# Patient Record
Sex: Female | Born: 1980 | Race: White | Hispanic: No | Marital: Single | Smoking: Current some day smoker
Health system: Southern US, Community
[De-identification: ages and names within clinical notes are randomized; demographics above are authoritative.]

---

## 2007-08-23 ENCOUNTER — Emergency Department: Payer: Self-pay | Admitting: Emergency Medicine

## 2010-03-18 ENCOUNTER — Ambulatory Visit: Payer: Self-pay | Admitting: Family Medicine

## 2015-03-13 ENCOUNTER — Ambulatory Visit: Payer: Medicaid Other

## 2015-03-13 ENCOUNTER — Ambulatory Visit
Admission: EM | Admit: 2015-03-13 | Discharge: 2015-03-13 | Disposition: A | Discharge status: Home / Self Care | Payer: Medicaid Other | Attending: Internal Medicine | Admitting: Internal Medicine

## 2015-03-13 ENCOUNTER — Encounter: Payer: Self-pay | Admitting: Emergency Medicine

## 2015-03-13 DIAGNOSIS — M25571 Pain in right ankle and joints of right foot: Secondary | ICD-10-CM | POA: Diagnosis present

## 2015-03-13 DIAGNOSIS — S82201A Unspecified fracture of shaft of right tibia, initial encounter for closed fracture: Secondary | ICD-10-CM

## 2015-03-13 DIAGNOSIS — S82301A Unspecified fracture of lower end of right tibia, initial encounter for closed fracture: Secondary | ICD-10-CM | POA: Diagnosis not present

## 2015-03-13 DIAGNOSIS — X58XXXA Exposure to other specified factors, initial encounter: Secondary | ICD-10-CM | POA: Insufficient documentation

## 2015-03-13 MED ORDER — NAPROXEN 500 MG PO TABS: 500.0000 mg | ORAL_TABLET | Freq: Two times a day (BID) | ORAL | Status: DC

## 2015-03-13 MED ORDER — HYDROCODONE-ACETAMINOPHEN 5-325 MG PO TABS: 2.0000 | ORAL_TABLET | ORAL | Status: DC | PRN

## 2015-03-13 NOTE — Discharge Instructions (Signed)
Wear boot until orthopedics says you can take it off.  Use crutches to keep weight off of R foot/ankle, until orthopedics clears you to put weight on that side. Elevate/use ice to help with swelling/pain. Prescriptions for naproxen and a small number of vicodin for pain. Followup with orthopedics in about 5-7 days for recheck.  [facility]/Dr Poggi [phone].  Your PCP may need to make the referral.  Tibial Fracture A tibial fracture is a break in your tibia bone. The tibia is the large shinbone in your lower leg. The bone will be held in place with a cast or splint until it is healed. HOME CARE  Put ice on the injured area.  Put ice in a plastic bag.  Place a towel between your skin and the bag.  Leave the ice on for 15-20 minutes, 03-04 times a day, for 2 days.  If you have a cast:  Do not scratch under the cast.  Check the skin around the cast every day. You may put lotion on any red or sore areas.  Keep your cast dry and clean.  If you have a splint:  Wear the splint as told by your doctor.  Loosen the elastic around the splint if your toes get numb, tingle, or turn cold or blue.  Do not put pressure on the cast or splint until it is hard.  Do not put the cast or splint in water. Cover it with a plastic bag when bathing.  Use crutches as told by your doctor.  Only take medicine as told by your doctor.  See your doctor as told for follow-up visits. GET HELP RIGHT AWAY IF:  Pain gets worse or is not controlled with medicine.  Puffiness (swelling) or redness gets worse.  You start to lose feeling in your foot or toes.  Your foot or toes get cold or blue.  You have pain in your leg, especially if it gets worse when you move your toes. MAKE SURE YOU:  Understand these instructions.  Will watch your condition.  Will get help right away if you are not doing well or get worse. Document Released: 10/03/2010 Document Revised: 11/23/2011  Document Reviewed: 10/25/2013 [facility] Patient Information 2015 [place]. This information is not intended to replace advice given to you by your health care provider. Make sure you discuss any questions you have with your health care provider.

## 2015-03-13 NOTE — ED Notes (Signed)
Patient states that she fell and twisted her right ankle and right foot yesterday.  Patient c/o pain in her right ankle and inner side of her right foot.

## 2015-03-14 NOTE — ED Provider Notes (Addendum)
CSN: [id number]     Arrival date & time 03/13/15  1611 History   First MD Initiated Contact with Patient 03/13/15 1650     Chief Complaint  Patient presents with  . Foot Pain  . Ankle Pain   Patient is a 34 y.o. female presenting with lower extremity pain and ankle pain.  Foot Pain  Ankle Pain    Pt twisted R foot/ankle while walking on uneven ground yesterday, and hyperextended R foot. Has not been able to bear weight on foot since due to severe pain.  No other injury reported.  Hx prior sprain in R ankle.  Ankle is swollen today; equivocal bruising.  History reviewed. No pertinent past medical history. History reviewed. No pertinent past surgical history. History reviewed. No pertinent family history. History  Substance Use Topics  . Smoking status: Current Some Day Smoker  . Smokeless tobacco: Never Used  . Alcohol Use: Yes   OB History    No data available     Review of Systems  All other systems reviewed and are negative.   Allergies  Penicillins  Home Medications no meds taken regularly.   Prior to Admission medications                         BP 142/78 mmHg  Pulse 80  Temp(Src) 97.8 F (36.6 C) (Tympanic)  Resp 16  Ht 5\' 3"  (1.6 m)  Wt 130 lb (58.968 kg)  BMI 23.03 kg/m2  SpO2 100%  LMP 03/11/2015 (Exact Date) Physical Exam  Constitutional: She is oriented to person, place, and time. No distress.  Alert, nicely groomed Using crutches.  HENT:  Head: Atraumatic.  Eyes:  Conjugate gaze, no eye redness/drainage  Neck: Neck supple.  Cardiovascular: Normal rate.   Pulmonary/Chest: No respiratory distress.  Abdominal: She exhibits no distension.  Musculoskeletal: Normal range of motion.  R ankle with lateral and medial swelling.  Focal tenderness deep to achilles tendon posteriorly.  Equivocal bruising.  No bony tenderness of lateral or medial malleolus.  Soft tissue tenderness anterior to lateral malleolus.  Skin intact.  Foot warm.  Limited ROM  ankle (pain); easily wiggles toes.  Neurological: She is alert and oriented to person, place, and time.  Skin: Skin is warm and dry.  No cyanosis  Nursing note and vitals reviewed.   ED Course  Procedures    Dg Ankle Complete Right  03/13/2015   CLINICAL DATA:  Twisting injury with ankle pain, swelling and bruising. Initial encounter.  EXAM: RIGHT ANKLE - COMPLETE 3+ VIEW  COMPARISON:  None.  FINDINGS: There is a subtle nondisplaced intra-articular fracture of the distal tibia posteriorly, best seen on the lateral view. This extends to the tibial plafond. No other fractures demonstrated. The talar dome appears intact. There is no dislocation. Marked lateral soft tissue swelling noted.  IMPRESSION: Nondisplaced intra-articular fracture involving the posterior aspect of the tibial plafond.   Electronically Signed   By: Carey BullocksWilliam  Veazey M.D.   On: 03/13/2015 17:20   Dg Foot Complete Right  03/13/2015   CLINICAL DATA:  Right foot injury, pain and swelling, stepping on stone on sandal  EXAM: RIGHT FOOT COMPLETE - 3+ VIEW  COMPARISON:  None.  FINDINGS: Three views of the right foot submitted. No acute fracture or subluxation. No radiopaque foreign body.  IMPRESSION: Negative.   Electronically Signed   By: Natasha MeadLiviu  Pop M.D.   On: 03/13/2015 17:18     MDM  1. Tibial fracture, right, closed, initial encounter    Boot orthosis applied by nurse.  Pt instructed to use crutches, non weight bearing until cleared by orthopedics to put weight on the R foot/ankle.  Leave orthosis on until followup with orthopedist.  Ice, elevate, to help with pain/swelling.  Followup ortho in 5-7 days.  Given office number for [facility] ortho (Dr Joice Lofts on call).    Eustace Moore, MD 03/18/15 1191  Eustace Moore, MD 03/18/15 [phone]

## 2015-10-26 ENCOUNTER — Emergency Department: Payer: Medicaid Other

## 2015-10-26 ENCOUNTER — Ambulatory Visit
Admission: EM | Admit: 2015-10-26 | Discharge: 2015-10-26 | Disposition: A | Discharge status: Other Acute Inpatient Hospital | Payer: Medicaid Other | Attending: Family Medicine | Admitting: Family Medicine

## 2015-10-26 ENCOUNTER — Encounter: Payer: Self-pay | Admitting: Emergency Medicine

## 2015-10-26 ENCOUNTER — Emergency Department
Admission: EM | Admit: 2015-10-26 | Discharge: 2015-10-26 | Disposition: A | Discharge status: Other Acute Inpatient Hospital | Payer: Medicaid Other | Attending: Emergency Medicine | Admitting: Emergency Medicine

## 2015-10-26 DIAGNOSIS — K223 Perforation of esophagus: Secondary | ICD-10-CM | POA: Insufficient documentation

## 2015-10-26 DIAGNOSIS — F17210 Nicotine dependence, cigarettes, uncomplicated: Secondary | ICD-10-CM | POA: Insufficient documentation

## 2015-10-26 DIAGNOSIS — R1110 Vomiting, unspecified: Secondary | ICD-10-CM

## 2015-10-26 DIAGNOSIS — Z3202 Encounter for pregnancy test, result negative: Secondary | ICD-10-CM | POA: Diagnosis not present

## 2015-10-26 DIAGNOSIS — Z880 Allergy status to penicillin: Secondary | ICD-10-CM | POA: Insufficient documentation

## 2015-10-26 DIAGNOSIS — R112 Nausea with vomiting, unspecified: Secondary | ICD-10-CM | POA: Diagnosis present

## 2015-10-26 DIAGNOSIS — K209 Esophagitis, unspecified without bleeding: Secondary | ICD-10-CM

## 2015-10-26 DIAGNOSIS — N289 Disorder of kidney and ureter, unspecified: Secondary | ICD-10-CM | POA: Insufficient documentation

## 2015-10-26 DIAGNOSIS — Z791 Long term (current) use of non-steroidal anti-inflammatories (NSAID): Secondary | ICD-10-CM | POA: Insufficient documentation

## 2015-10-26 DIAGNOSIS — R110 Nausea: Secondary | ICD-10-CM | POA: Diagnosis not present

## 2015-10-26 DIAGNOSIS — F1010 Alcohol abuse, uncomplicated: Secondary | ICD-10-CM | POA: Diagnosis not present

## 2015-10-26 DIAGNOSIS — K920 Hematemesis: Secondary | ICD-10-CM | POA: Insufficient documentation

## 2015-10-26 DIAGNOSIS — F1011 Alcohol abuse, in remission: Secondary | ICD-10-CM

## 2015-10-26 LAB — CBC WITH DIFFERENTIAL/PLATELET
BASOS ABS: 0.1 10*3/uL (ref 0–0.1)
Basophils Relative: 1 %
EOS PCT: 0 %
Eosinophils Absolute: 0 10*3/uL (ref 0–0.7)
HEMATOCRIT: 43.5 % (ref 35.0–47.0)
HEMOGLOBIN: 13.6 g/dL (ref 12.0–16.0)
LYMPHS PCT: 5 %
Lymphs Abs: 1.1 10*3/uL (ref 1.0–3.6)
MCH: 29.4 pg (ref 26.0–34.0)
MCHC: 31.2 g/dL — ABNORMAL LOW (ref 32.0–36.0)
MCV: 94.3 fL (ref 80.0–100.0)
Monocytes Absolute: 1.2 10*3/uL — ABNORMAL HIGH (ref 0.2–0.9)
Monocytes Relative: 6 %
NEUTROS ABS: 19.6 10*3/uL — AB (ref 1.4–6.5)
Neutrophils Relative %: 88 %
PLATELETS: 343 10*3/uL (ref 150–440)
RBC: 4.61 MIL/uL (ref 3.80–5.20)
RDW: 17.1 % — ABNORMAL HIGH (ref 11.5–14.5)
WBC: 22 10*3/uL — AB (ref 3.6–11.0)

## 2015-10-26 LAB — URINALYSIS COMPLETE WITH MICROSCOPIC (ARMC ONLY)
BACTERIA UA: NONE SEEN
Bilirubin Urine: NEGATIVE
Glucose, UA: NEGATIVE mg/dL
Leukocytes, UA: NEGATIVE
NITRITE: NEGATIVE
PH: 5 (ref 5.0–8.0)
Protein, ur: 100 mg/dL — AB
Specific Gravity, Urine: 1.015 (ref 1.005–1.030)

## 2015-10-26 LAB — COMPREHENSIVE METABOLIC PANEL
ALK PHOS: 92 U/L (ref 38–126)
ALT: 96 U/L — AB (ref 14–54)
ALT: 92 U/L — ABNORMAL HIGH (ref 14–54)
ANION GAP: 29 — AB (ref 5–15)
AST: 132 U/L — AB (ref 15–41)
AST: 122 U/L — ABNORMAL HIGH (ref 15–41)
Albumin: 5.6 g/dL — ABNORMAL HIGH (ref 3.5–5.0)
Albumin: 5.3 g/dL — ABNORMAL HIGH (ref 3.5–5.0)
Alkaline Phosphatase: 89 U/L (ref 38–126)
Anion gap: 28 — ABNORMAL HIGH (ref 5–15)
BILIRUBIN TOTAL: 1.7 mg/dL — AB (ref 0.3–1.2)
BUN: 13 mg/dL (ref 6–20)
BUN: 13 mg/dL (ref 6–20)
CALCIUM: 9 mg/dL (ref 8.9–10.3)
CHLORIDE: 97 mmol/L — AB (ref 101–111)
CO2: 8 mmol/L — AB (ref 22–32)
CO2: 8 mmol/L — ABNORMAL LOW (ref 22–32)
CREATININE: 1.02 mg/dL — AB (ref 0.44–1.00)
Calcium: 8.9 mg/dL (ref 8.9–10.3)
Chloride: 101 mmol/L (ref 101–111)
Creatinine, Ser: 1.15 mg/dL — ABNORMAL HIGH (ref 0.44–1.00)
GFR calc Af Amer: >60 mL/min (ref >60)
GFR calc non Af Amer: >60 mL/min (ref >60)
GFR calc non Af Amer: >60 mL/min (ref >60)
GLUCOSE: 188 mg/dL — AB (ref 65–99)
Glucose, Bld: 165 mg/dL — ABNORMAL HIGH (ref 65–99)
Potassium: 4.7 mmol/L (ref 3.5–5.1)
Potassium: 5.2 mmol/L — ABNORMAL HIGH (ref 3.5–5.1)
Sodium: 133 mmol/L — ABNORMAL LOW (ref 135–145)
Sodium: 138 mmol/L (ref 135–145)
TOTAL PROTEIN: 9.3 g/dL — AB (ref 6.5–8.1)
Total Bilirubin: 1.4 mg/dL — ABNORMAL HIGH (ref 0.3–1.2)
Total Protein: 9.5 g/dL — ABNORMAL HIGH (ref 6.5–8.1)

## 2015-10-26 LAB — CBC
HEMATOCRIT: 41.5 % (ref 35.0–47.0)
Hemoglobin: 12.9 g/dL (ref 12.0–16.0)
MCH: 28.8 pg (ref 26.0–34.0)
MCHC: 31 g/dL — ABNORMAL LOW (ref 32.0–36.0)
MCV: 92.8 fL (ref 80.0–100.0)
Platelets: 325 10*3/uL (ref 150–440)
RBC: 4.47 MIL/uL (ref 3.80–5.20)
RDW: 17.2 % — AB (ref 11.5–14.5)
WBC: 19.9 10*3/uL — ABNORMAL HIGH (ref 3.6–11.0)

## 2015-10-26 LAB — POCT PREGNANCY, URINE: PREG TEST UR: NEGATIVE

## 2015-10-26 LAB — HCG, QUANTITATIVE, PREGNANCY

## 2015-10-26 LAB — LACTIC ACID, PLASMA: LACTIC ACID, VENOUS: 0.9 mmol/L (ref 0.5–2.0)

## 2015-10-26 LAB — LIPASE, BLOOD: Lipase: 33 U/L (ref 11–51)

## 2015-10-26 MED ORDER — IOHEXOL 240 MG/ML SOLN
25.0000 mL | Freq: Once | INTRAMUSCULAR | Status: DC | PRN
Start: 2015-10-26 — End: 2015-10-27

## 2015-10-26 MED ORDER — LORAZEPAM 2 MG/ML IJ SOLN
1.0000 mg | Freq: Once | INTRAMUSCULAR | Status: AC
  Administered 2015-10-26: 1 mg via INTRAVENOUS
  Filled 2015-10-26: qty 1

## 2015-10-26 MED ORDER — SODIUM CHLORIDE 0.9 % IV BOLUS (SEPSIS)
1000.0000 mL | Freq: Once | INTRAVENOUS | Status: AC
Start: 2015-10-26 — End: 2015-10-26
  Administered 2015-10-26: 1000 mL via INTRAVENOUS

## 2015-10-26 MED ORDER — ONDANSETRON HCL 4 MG/2ML IJ SOLN
INTRAMUSCULAR | Status: AC
  Filled 2015-10-26: qty 2

## 2015-10-26 MED ORDER — MORPHINE SULFATE (PF) 4 MG/ML IV SOLN
INTRAVENOUS | Status: AC
  Filled 2015-10-26: qty 1

## 2015-10-26 MED ORDER — SODIUM CHLORIDE 0.9 % IV BOLUS (SEPSIS)
1000.0000 mL | Freq: Once | INTRAVENOUS | Status: AC
  Administered 2015-10-26: 1000 mL via INTRAVENOUS

## 2015-10-26 MED ORDER — PROMETHAZINE HCL 25 MG/ML IJ SOLN
25.0000 mg | Freq: Once | INTRAMUSCULAR | Status: AC
  Administered 2015-10-26: 25 mg via INTRAVENOUS
  Filled 2015-10-26: qty 1

## 2015-10-26 MED ORDER — ONDANSETRON HCL 4 MG/2ML IJ SOLN
4.0000 mg | Freq: Once | INTRAMUSCULAR | Status: AC
  Administered 2015-10-26: 4 mg via INTRAVENOUS

## 2015-10-26 MED ORDER — FAMOTIDINE IN NACL 20-0.9 MG/50ML-% IV SOLN
20.0000 mg | Freq: Once | INTRAVENOUS | Status: AC
  Administered 2015-10-26: 20 mg via INTRAVENOUS
  Filled 2015-10-26: qty 50

## 2015-10-26 MED ORDER — ONDANSETRON HCL 4 MG/2ML IJ SOLN
4.0000 mg | Freq: Once | INTRAMUSCULAR | Status: AC
  Administered 2015-10-26: 4 mg via INTRAVENOUS
  Filled 2015-10-26: qty 2

## 2015-10-26 MED ORDER — PROMETHAZINE HCL 25 MG/ML IJ SOLN
25.0000 mg | Freq: Once | INTRAMUSCULAR | Status: AC
  Administered 2015-10-26: 25 mg via INTRAMUSCULAR

## 2015-10-26 MED ORDER — DEXTROSE 5 % IV SOLN
2.0000 g | Freq: Once | INTRAVENOUS | Status: AC
  Administered 2015-10-26: 2 g via INTRAVENOUS
  Filled 2015-10-26: qty 2

## 2015-10-26 MED ORDER — MORPHINE SULFATE (PF) 4 MG/ML IV SOLN
4.0000 mg | Freq: Once | INTRAVENOUS | Status: AC
  Administered 2015-10-26: 4 mg via INTRAVENOUS

## 2015-10-26 MED ORDER — VANCOMYCIN HCL IN DEXTROSE 1-5 GM/200ML-% IV SOLN
1000.0000 mg | Freq: Once | INTRAVENOUS | Status: AC
  Administered 2015-10-26: 1000 mg via INTRAVENOUS
  Filled 2015-10-26 (×2): qty 200

## 2015-10-26 MED ORDER — IOHEXOL 300 MG/ML  SOLN
100.0000 mL | Freq: Once | INTRAMUSCULAR | Status: AC | PRN
  Administered 2015-10-26: 100 mL via INTRAVENOUS

## 2015-10-26 NOTE — ED Notes (Signed)
Pt left department with [place] em[facility] in no acute distress. Pt sleeping, resps unlabored. Mother has clothing and cell phone. Pt transported with yellow colored necklace in place.

## 2015-10-26 NOTE — ED Provider Notes (Signed)
CSN: [id number]     Arrival date & time 10/26/15  1234 History   None    Chief Complaint  Patient presents with  . Hematemesis    Pt reports vomiting blood starting today. Reports she passed out in the bathroom at home.    (Consider location/radiation/quality/duration/timing/severity/associated sxs/prior Treatment) HPI Comments: 35 yo female with a h/o alcohol abuse, presents with vomiting since 1am with intermittent bright red blood and dark brown emesis; also stomach pains. Denies fevers, chills, diarrhea. States has "a couple of drinks almost every day" and yesterday had "2 drinks of rum and coke". Denies chest pains but today has felt short of breath.   The history is provided by the patient and a parent.    No past medical history on file. No past surgical history on file. No family history on file. Social History  Substance Use Topics  . Smoking status: Current Every Day Smoker -- 1.00 packs/day    Types: Cigarettes  . Smokeless tobacco: Never Used  . Alcohol Use: Yes     Comment: last drink last night, drinks most days   OB History    No data available     Review of Systems  Allergies  Penicillins  Home Medications   Prior to Admission medications   Medication Sig Start Date End Date Taking? Authorizing Provider  HYDROcodone-acetaminophen (NORCO/VICODIN) 5-325 MG per tablet Take 2 tablets by mouth every 4 (four) hours as needed. 03/13/15   Eustace Moore, MD  naproxen (NAPROSYN) 500 MG tablet Take 1 tablet (500 mg total) by mouth 2 (two) times daily. 03/13/15   Eustace Moore, MD   Meds Ordered and Administered this Visit   Medications  promethazine (PHENERGAN) injection 25 mg (25 mg Intramuscular Given 10/26/15 1251)    BP 123/87 mmHg  Pulse 143  Resp 22  SpO2 99%  LMP 10/19/2015 No data found.   Physical Exam  Constitutional: She appears well-developed and well-nourished. She does not appear ill.  Cardiovascular: Regular rhythm and normal heart sounds.   Tachycardia present.   Pulmonary/Chest: Effort normal and breath sounds normal. No respiratory distress. She has no wheezes. She has no rales.  Abdominal: Soft. Bowel sounds are normal. She exhibits no distension and no mass. There is tenderness (epigastric ). There is no rebound and no guarding.  Neurological: She is alert.  Skin: She is not diaphoretic.  Nursing note and vitals reviewed.   ED Course  Procedures (including critical care time)  Labs Review Labs Reviewed  CBC WITH DIFFERENTIAL/PLATELET - Abnormal; Notable for the following:    WBC 22.0 (*)    MCHC 31.2 (*)    RDW 17.1 (*)    Neutro Abs 19.6 (*)    Monocytes Absolute 1.2 (*)    All other components within normal limits  COMPREHENSIVE METABOLIC PANEL  HCG, QUANTITATIVE, PREGNANCY    Imaging Review No results found.   Visual Acuity Review  Right Eye Distance:   Left Eye Distance:   Bilateral Distance:    Right Eye Near:   Left Eye Near:    Bilateral Near:      Positive hemoccult emesis    MDM   1. Coffee ground emesis   2. Hematemesis with nausea   3. H/O alcohol abuse     Recommend patient go to ED by EMS for further evaluation and management. IVF started. Phenergan  IM given. CBC, CMP labs drawn.  Patient transported left facility by EMS in stable condition. Report called  to Consulting civil engineer at [facility] ED.   Payton Mccallum, MD 10/26/15 1316

## 2015-10-26 NOTE — ED Provider Notes (Addendum)
[facility] Emergency Department Provider Note  ____________________________________________  Time seen: Approximately 2:13 PM  I have reviewed the triage vital signs and the nursing notes.   HISTORY  Chief Complaint Emesis    HPI Carly King is a 35 y.o. female , otherwise healthy, presenting with nausea and vomiting. Patient reports that her entire family has had nausea vomiting and diarrhea for the past week. For the past several days, she has had decreased appetite and "my stomach has felt funny." Yesterday she did not eat at all, then drank 2 rum and cokes. She woke up several hours later with ongoing controllable nausea and vomiting, dry heaving. No abdominal pain, diarrhea. No urinary symptoms but the patient has bilateral low back pain since onset of her symptoms. No change in vaginal discharge. She was seen at urgent care was given IM Phenergan and sent over here, and received Zofran on arrival and is still actively heaving.   History reviewed. No pertinent past medical history.  There are no active problems to display for this patient.   No past surgical history on file.  Current Outpatient Rx  Name  Route  Sig  Dispense  Refill  . HYDROcodone-acetaminophen (NORCO/VICODIN) 5-325 MG per tablet   Oral   Take 2 tablets by mouth every 4 (four) hours as needed.   6 tablet   0   . naproxen (NAPROSYN) 500 MG tablet   Oral   Take 1 tablet (500 mg total) by mouth 2 (two) times daily.   30 tablet   0     Allergies Penicillins  No family history on file.  Social History Social History  Substance Use Topics  . Smoking status: Current Some Day Smoker -- 1.00 packs/day    Types: Cigarettes  . Smokeless tobacco: Never Used  . Alcohol Use: Yes     Comment: last drink last night, drinks most days    Review of Systems Constitutional: No fever/chills. No lightheadedness or syncope. Eyes: No visual changes. ENT: No sore  throat. Cardiovascular: Denies chest pain, palpitations. Respiratory: Denies shortness of breath.  No cough. Gastrointestinal: No abdominal pain.  Positive nausea, positive vomiting.  No diarrhea.  No constipation. Genitourinary: Negative for dysuria. Musculoskeletal: Positive low back pain. Skin: Negative for rash. Neurological: Negative for headaches, focal weakness or numbness.  10-point ROS otherwise negative.  ____________________________________________   PHYSICAL EXAM:  VITAL SIGNS: ED Triage Vitals  Enc Vitals Group     BP 10/26/15 1334 139/81 mmHg     Pulse Rate 10/26/15 1334 144     Resp 10/26/15 1334 26     Temp 10/26/15 1334 98.1 F (36.7 C)     Temp Source 10/26/15 1334 Axillary     SpO2 10/26/15 1334 99 %     Weight 10/26/15 1334 130 lb (58.968 kg)     Height 10/26/15 1334  (1.6 m)     Head Cir --      Peak Flow --      Pain Score 10/26/15 1335 6     Pain Loc --      Pain Edu? --      Excl. in GC? --     Constitutional: Alert and oriented. Well appearing and in no acute distress. Answer question appropriately. Eyes: Conjunctivae are normal.  EOMI. no scleral icterus Head: Atraumatic. Nose: No congestion/rhinnorhea. Mouth/Throat: Mucous membranes are dry. Actively dry heaving throughout my examination. Neck: No stridor.  Supple.  No JVD. Cardiovascular: Normal rate,  regular rhythm. No murmurs, rubs or gallops.  Respiratory: Normal respiratory effort.  No retractions. Lungs CTAB.  No wheezes, rales or ronchi. Gastrointestinal: Soft and nontender. No distention. No peritoneal signs. No guarding or rebound. Negative Murphy sign. Musculoskeletal: No LE edema.  Neurologic:  Normal speech and language. No gross focal neurologic deficits are appreciated.  Skin:  Skin is warm, dry and intact. No rash noted. Psychiatric: Mood and affect are normal. Speech and behavior are normal.  Normal judgement.  ____________________________________________   LABS (all  labs ordered are listed, but only abnormal results are displayed)  Labs Reviewed  CBC - Abnormal; Notable for the following:    WBC 19.9 (*)    MCHC 31.0 (*)    RDW 17.2 (*)    All other components within normal limits  COMPREHENSIVE METABOLIC PANEL - Abnormal; Notable for the following:    Potassium 5.2 (*)    CO2 8 (*)    Glucose, Bld 188 (*)    Creatinine, Ser 1.15 (*)    Total Protein 9.3 (*)    Albumin 5.3 (*)    AST 122 (*)    ALT 92 (*)    Total Bilirubin 1.7 (*)    Anion gap 29 (*)    All other components within normal limits  URINALYSIS COMPLETEWITH MICROSCOPIC ([facility] ONLY) - Abnormal; Notable for the following:    Color, Urine YELLOW (*)    APPearance CLEAR (*)    Ketones, ur 2+ (*)    Hgb urine dipstick 2+ (*)    Protein, ur 100 (*)    Squamous Epithelial / LPF 0-5 (*)    All other components within normal limits  LIPASE, BLOOD  POC URINE PREG, ED  POCT PREGNANCY, URINE   ____________________________________________  EKG  EKG, read by me.  Time: 14:35 Rate: 137 Rhythm: sinus tach Axis: normal Intervals:  Normal ST: no STEMI ____________________________________________  RADIOLOGY  No results found.  ____________________________________________   PROCEDURES  Procedure(s) performed: None  Critical Care performed: No ____________________________________________   INITIAL IMPRESSION / ASSESSMENT AND PLAN / ED COURSE  Pertinent labs & imaging results that were available during my care of the patient were reviewed by me and considered in my medical decision making (see chart for details).  35 y.o. female presenting with intractable nausea and vomiting for the past several hours. The patient is afebrile with a sinus tachycardia. The most likely etiology of her symptoms is a viral GI infection or flulike illness which she contracted from her family. However, her alcohol use could have also contributed to her symptoms although this does not sound  like a large amount of alcohol for her. I will rule out pregnancy and urinary tract infection given the low back pain. There is no focal pain on her abdominal examination.  ----------------------------------------- 2:20 PM on 10/26/2015 -----------------------------------------  The patient does have a white blood cell count of approximately 20 between here and there urgent care center. I will get a CT scan of her abdomen to rule out any acute pathology. Will continue to be aggressive about treating her symptoms.  ----------------------------------------- 3:14 PM on 10/26/2015 -----------------------------------------  The patient was feeling better with improved nausea, but then began to have vomiting when she attempted to ingest contrast. The vomit is a darkish brown but it is unclear whether it is blood in it is a minimal amount. Given the amount of heaving that the patient has had is not unreasonable that she may have some irritation causing a  mild amount of bleeding but at this time there is no evidence for severe amount of bleeding. She does have some tachycardia but this is improving with fluids and her hematocrit is normal. We will continue to monitor any vomitus that she has. I have talked to the CT tech to common in the patient for CT without oral contrast as I do not believe she is currently able to tolerate it.  ----------------------------------------- 3:42 PM on 10/26/2015 -----------------------------------------  Pt signed out to Dr. Shaune Pollack, who will f/u CT abd and re-evaluate the patient and her symptoms.  ____________________________________________  FINAL CLINICAL IMPRESSION(S) / ED DIAGNOSES  Final diagnoses:  Acute renal insufficiency  Intractable vomiting with nausea, vomiting of unspecified type      NEW MEDICATIONS STARTED DURING THIS VISIT:  New Prescriptions   No medications on file     Rockne Menghini, MD 10/26/15 1543  Rockne Menghini,  MD 10/26/15 1557

## 2015-10-26 NOTE — ED Notes (Signed)
Pt to ED via EMS from [facility] c/o emesis with "coffee grounds" appearance. Per pt vomiting started this morning around 0100. Pt given  Phenergan at urgent care. No diarrhea, no urinary symptoms. EMS reports x1 episode vomiting en route, EMS state they did not note coffee grounds at that time. Per EMS, pt has hx alcohol abuse, pt states last drink was last night around 2000.

## 2015-10-26 NOTE — ED Notes (Signed)
Pt sleeping, pt's family updated on transfer process.

## 2015-10-26 NOTE — ED Notes (Signed)
Ems here for transport

## 2015-10-26 NOTE — ED Provider Notes (Signed)
[facility]  I accepted care from [facility] ____________________________________________     RADIOLOGY All xrays were viewed by me. Imaging interpreted by radiologist.  Ct abd pel:   IMPRESSION: 1. Fairly marked thickening of the walls of the lower thoracic esophagus, with associated paraesophageal fluid/inflammation. Esophagitis? Boerhaave syndrome? 2. Probable fatty infiltration of the liver. 3. Remainder of the abdomen and pelvis CT is unremarkable. No free intraperitoneal air.  ____________________________________________   PROCEDURES  Procedure(s) performed: None  Critical Care performed: None  ____________________________________________   INITIAL IMPRESSION / ASSESSMENT AND PLAN / ED COURSE   Pertinent labs & imaging results that were available during my care of the patient were reviewed by me and considered in my medical decision making (see chart for details).  Suspect virus vs alcoholic gastritis.  CT showing esophagitis vs Boerhaave's.  Given possibility of esophageal rupture, discussed transfer and pt accepted ER to ER to [facility], Dr. Ranell Patrick.  I spoke with CT surgery Dr. Oneida Alar to eval in ER.  Vancomycin and Cefepime given for possible esophageal rupture.  HR to 130s, BP 130's systolic.  Given  ativan for possible contribution from alcohol withdrawal.   Patient / Family / Caregiver informed of clinical course, medical decision-making process, and agree with plan.  ____________________________________________   FINAL CLINICAL IMPRESSION(S) / ED DIAGNOSES  Final diagnoses:  Acute renal insufficiency  Intractable vomiting with nausea, vomiting of unspecified type  Esophagitis  Boerhaave's syndrome  Boerhaave's Syndrome      Governor Rooks, MD 10/26/15 1946

## 2015-10-26 NOTE — ED Notes (Signed)
IV inserted per protocol #20g RAC, good blood return. Labs drawn and sent. IVF NS initiated.

## 2015-10-26 NOTE — ED Notes (Signed)
Report received from [place], [place]. Pt with mother and daughter at bedside. Explanation of plan of care provided to pt and mother who verbalize understanding. Pt with pwd skin. Pt placed on cardiac monitor and gown. Second iv site initiated. Blood cultures and lactic acid obtained.

## 2015-10-26 NOTE — ED Notes (Signed)
EMS is at bedside for transport. Mother and daughter at bedside.

## 2015-10-31 LAB — CULTURE, BLOOD (ROUTINE X 2)
Culture: NO GROWTH
Culture: NO GROWTH

## 2015-11-09 ENCOUNTER — Ambulatory Visit
Admission: EM | Admit: 2015-11-09 | Discharge: 2015-11-09 | Disposition: A | Discharge status: Home / Self Care | Payer: Medicaid Other | Attending: Family Medicine | Admitting: Family Medicine

## 2015-11-09 DIAGNOSIS — J029 Acute pharyngitis, unspecified: Secondary | ICD-10-CM | POA: Diagnosis present

## 2015-11-09 DIAGNOSIS — J069 Acute upper respiratory infection, unspecified: Secondary | ICD-10-CM | POA: Diagnosis not present

## 2015-11-09 DIAGNOSIS — B9789 Other viral agents as the cause of diseases classified elsewhere: Secondary | ICD-10-CM

## 2015-11-09 LAB — RAPID STREP SCREEN (MED CTR MEBANE ONLY): STREPTOCOCCUS, GROUP A SCREEN (DIRECT): NEGATIVE

## 2015-11-09 MED ORDER — GUAIFENESIN-CODEINE 100-10 MG/5ML PO SOLN: 10.0000 mL | Freq: Four times a day (QID) | ORAL | Status: DC | PRN

## 2015-11-09 NOTE — ED Provider Notes (Signed)
CSN: [id number]     Arrival date & time 11/09/15  1115 History   First MD Initiated Contact with Patient 11/09/15 1258     Chief Complaint  Patient presents with  . Sore Throat  . Cough   (Consider location/radiation/quality/duration/timing/severity/associated sxs/prior Treatment) Patient is a 35 y.o. female presenting with URI. The history is provided by the patient.  URI Presenting symptoms: congestion, cough, fatigue, fever, rhinorrhea and sore throat   Severity:  Moderate Onset quality:  Sudden Duration:  3 days Timing:  Constant Progression:  Unchanged Chronicity:  New Relieved by:  None tried Worsened by:  Nothing tried Ineffective treatments:  None tried Associated symptoms: no arthralgias, no headaches, no myalgias, no neck pain, no sinus pain, no sneezing, no swollen glands and no wheezing   Risk factors: sick contacts   Risk factors: not elderly, no chronic cardiac disease, no chronic kidney disease, no chronic respiratory disease, no diabetes mellitus, no immunosuppression, no recent illness and no recent travel     History reviewed. No pertinent past medical history. History reviewed. No pertinent past surgical history. History reviewed. No pertinent family history. Social History  Substance Use Topics  . Smoking status: Current Some Day Smoker -- 1.00 packs/day    Types: Cigarettes  . Smokeless tobacco: Never Used  . Alcohol Use: Yes     Comment: last drink last night, drinks most days   OB History    No data available     Review of Systems  Constitutional: Positive for fever and fatigue.  HENT: Positive for congestion, rhinorrhea and sore throat. Negative for sneezing.   Respiratory: Positive for cough. Negative for wheezing.   Musculoskeletal: Negative for myalgias, arthralgias and neck pain.  Neurological: Negative for headaches.    Allergies  Penicillins  Home Medications   Prior to Admission medications   Medication Sig Start Date End Date  Taking? Authorizing Provider  omeprazole (PRILOSEC) 20 MG capsule Take 20 mg by mouth daily.   Yes Historical Provider, MD  pantoprazole (PROTONIX) 40 MG tablet Take 40 mg by mouth daily.   Yes Historical Provider, MD  guaiFENesin-codeine 100-10 MG/5ML syrup Take 10 mLs by mouth every 6 (six) hours as needed for cough. 11/09/15   Payton Mccallum, MD  HYDROcodone-acetaminophen (NORCO/VICODIN) 5-325 MG per tablet Take 2 tablets by mouth every 4 (four) hours as needed. 03/13/15   Eustace Moore, MD  naproxen (NAPROSYN) 500 MG tablet Take 1 tablet (500 mg total) by mouth 2 (two) times daily. 03/13/15   Eustace Moore, MD  sucralfate (CARAFATE) 1 g tablet Take 1 g by mouth 4 (four) times daily -  with meals and at bedtime.    Historical Provider, MD   Meds Ordered and Administered this Visit  Medications - No data to display  BP 122/78 mmHg  Pulse 72  Temp(Src) 98.2 F (36.8 C) (Oral)  Resp 16  Ht  (1.575 m)  Wt 130 lb (58.968 kg)  BMI 23.77 kg/m2  SpO2 100%  LMP 10/28/2015 No data found.   Physical Exam  Constitutional: She appears well-developed and well-nourished. No distress.  HENT:  Head: Normocephalic.  Right Ear: Tympanic membrane, external ear and ear canal normal.  Left Ear: Tympanic membrane, external ear and ear canal normal.  Nose: Rhinorrhea present.  Mouth/Throat: Uvula is midline and mucous membranes are normal. Posterior oropharyngeal erythema present. No oropharyngeal exudate, posterior oropharyngeal edema or tonsillar abscesses.  Eyes: Conjunctivae and EOM are normal. Pupils are equal, round, and  reactive to light. Right eye exhibits no discharge. Left eye exhibits no discharge. No scleral icterus.  Neck: Normal range of motion. Neck supple. No JVD present. No tracheal deviation present. No thyromegaly present.  Cardiovascular: Normal rate, regular rhythm, normal heart sounds and intact distal pulses.   No murmur heard. Pulmonary/Chest: Effort normal and breath sounds  normal. No stridor. No respiratory distress. She has no wheezes. She has no rales. She exhibits no tenderness.  Abdominal: Soft. Bowel sounds are normal.  Lymphadenopathy:    She has no cervical adenopathy.  Skin: Skin is warm. She is not diaphoretic.  Vitals reviewed.   ED Course  Procedures (including critical care time)  Labs Review Labs Reviewed  RAPID STREP SCREEN (NOT AT [facility])  CULTURE, GROUP A STREP [facility])    Imaging Review No results found.   Visual Acuity Review  Right Eye Distance:   Left Eye Distance:   Bilateral Distance:    Right Eye Near:   Left Eye Near:    Bilateral Near:         MDM   1. Viral URI with cough    Discharge Medication List as of 11/09/2015  1:25 PM    START taking these medications   Details  guaiFENesin-codeine 100-10 MG/5ML syrup Take 10 mLs by mouth every 6 (six) hours as needed for cough., Starting 11/09/2015, Until Discontinued, Print       1. Lab results and diagnosis reviewed with patient 2. rx as per orders above; reviewed possible side effects, interactions, risks and benefits  3. Recommend supportive treatment with otc analgesics, increased fluids, rest 4. Follow-up prn if symptoms worsen or don't improve    Payton Mccallum, MD 11/09/15 1553

## 2015-11-09 NOTE — ED Notes (Signed)
Patient c/o sore throat, cough, stuffy nose, fever, and chills which started 2 days ago.  Both of her kids at home have strep throat.

## 2015-11-11 LAB — CULTURE, GROUP A STREP (THRC)

## 2017-06-03 IMAGING — CR DG FOOT COMPLETE 3+V*R*
1 series · 3 of 3 positions shown · non-contrast
Comparison: None.

CLINICAL DATA: Right foot injury, pain and swelling, stepping on
stone on [address]

EXAM:
RIGHT FOOT COMPLETE - 3+ VIEW

[Series 1: ap · 0.17mm/px · 3 of 3 slices shown]
[im 1/3]
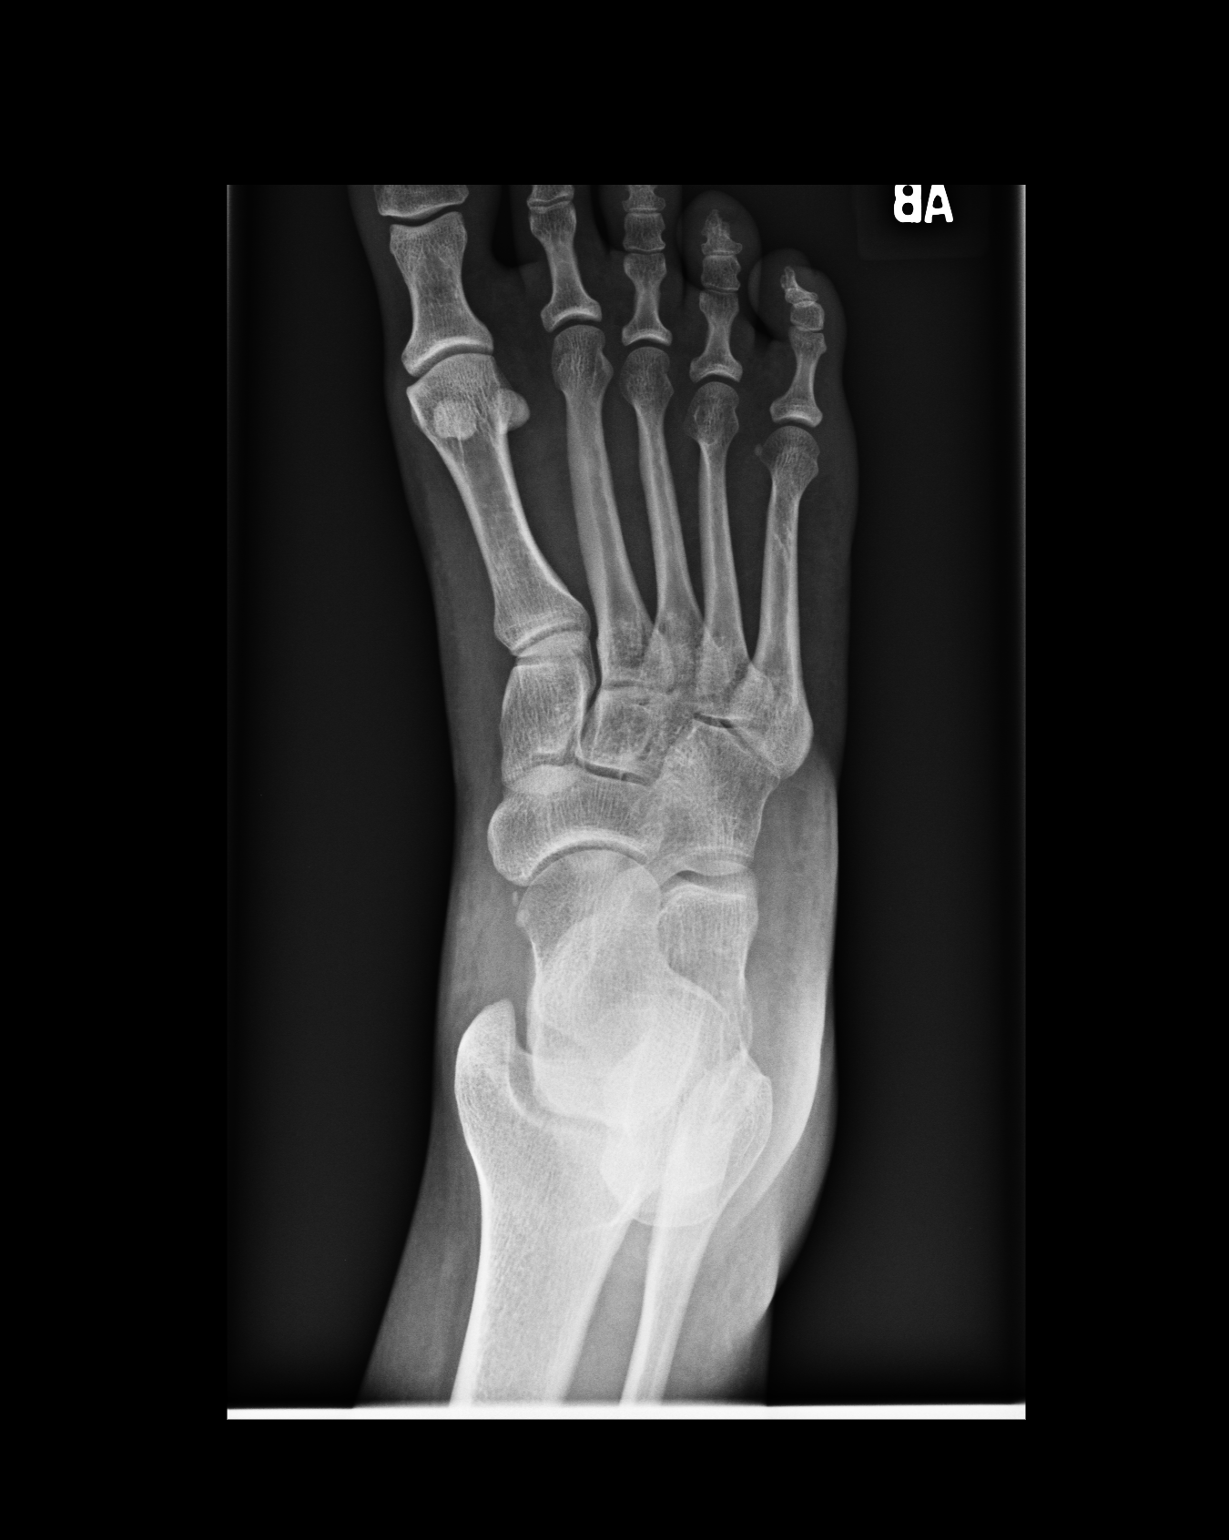
[im 2/3]
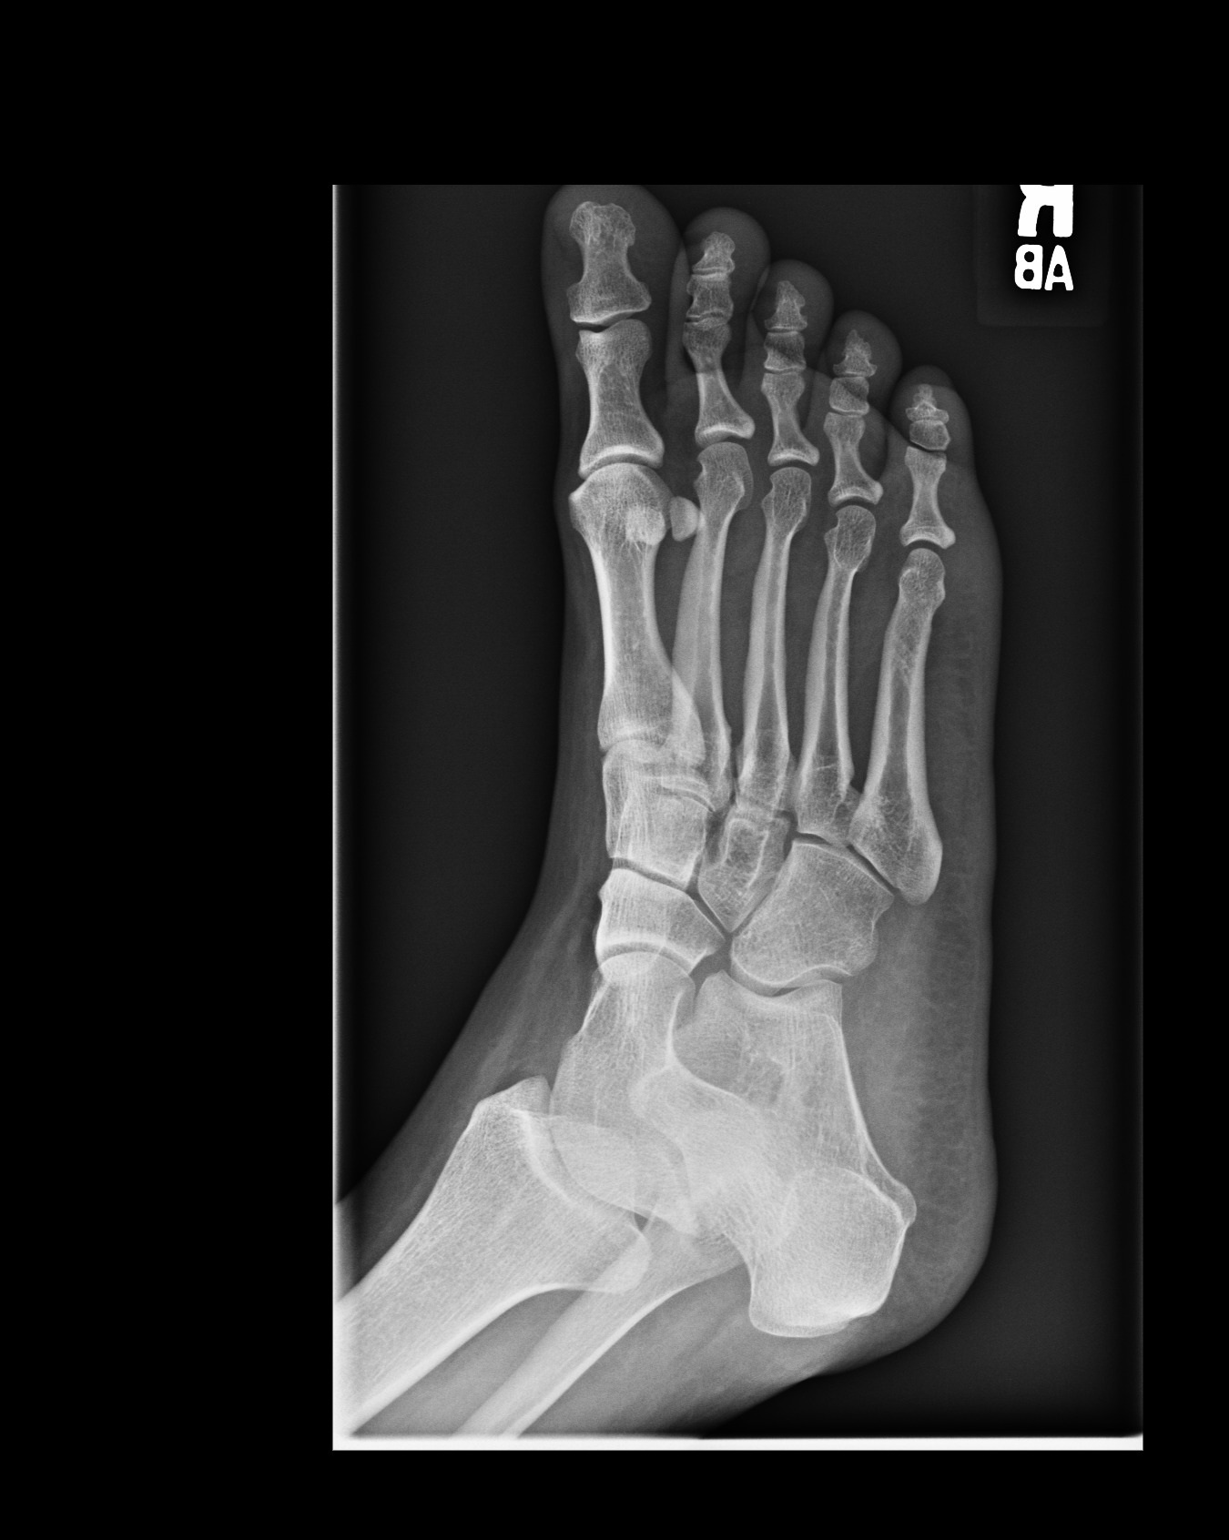
[im 3/3]
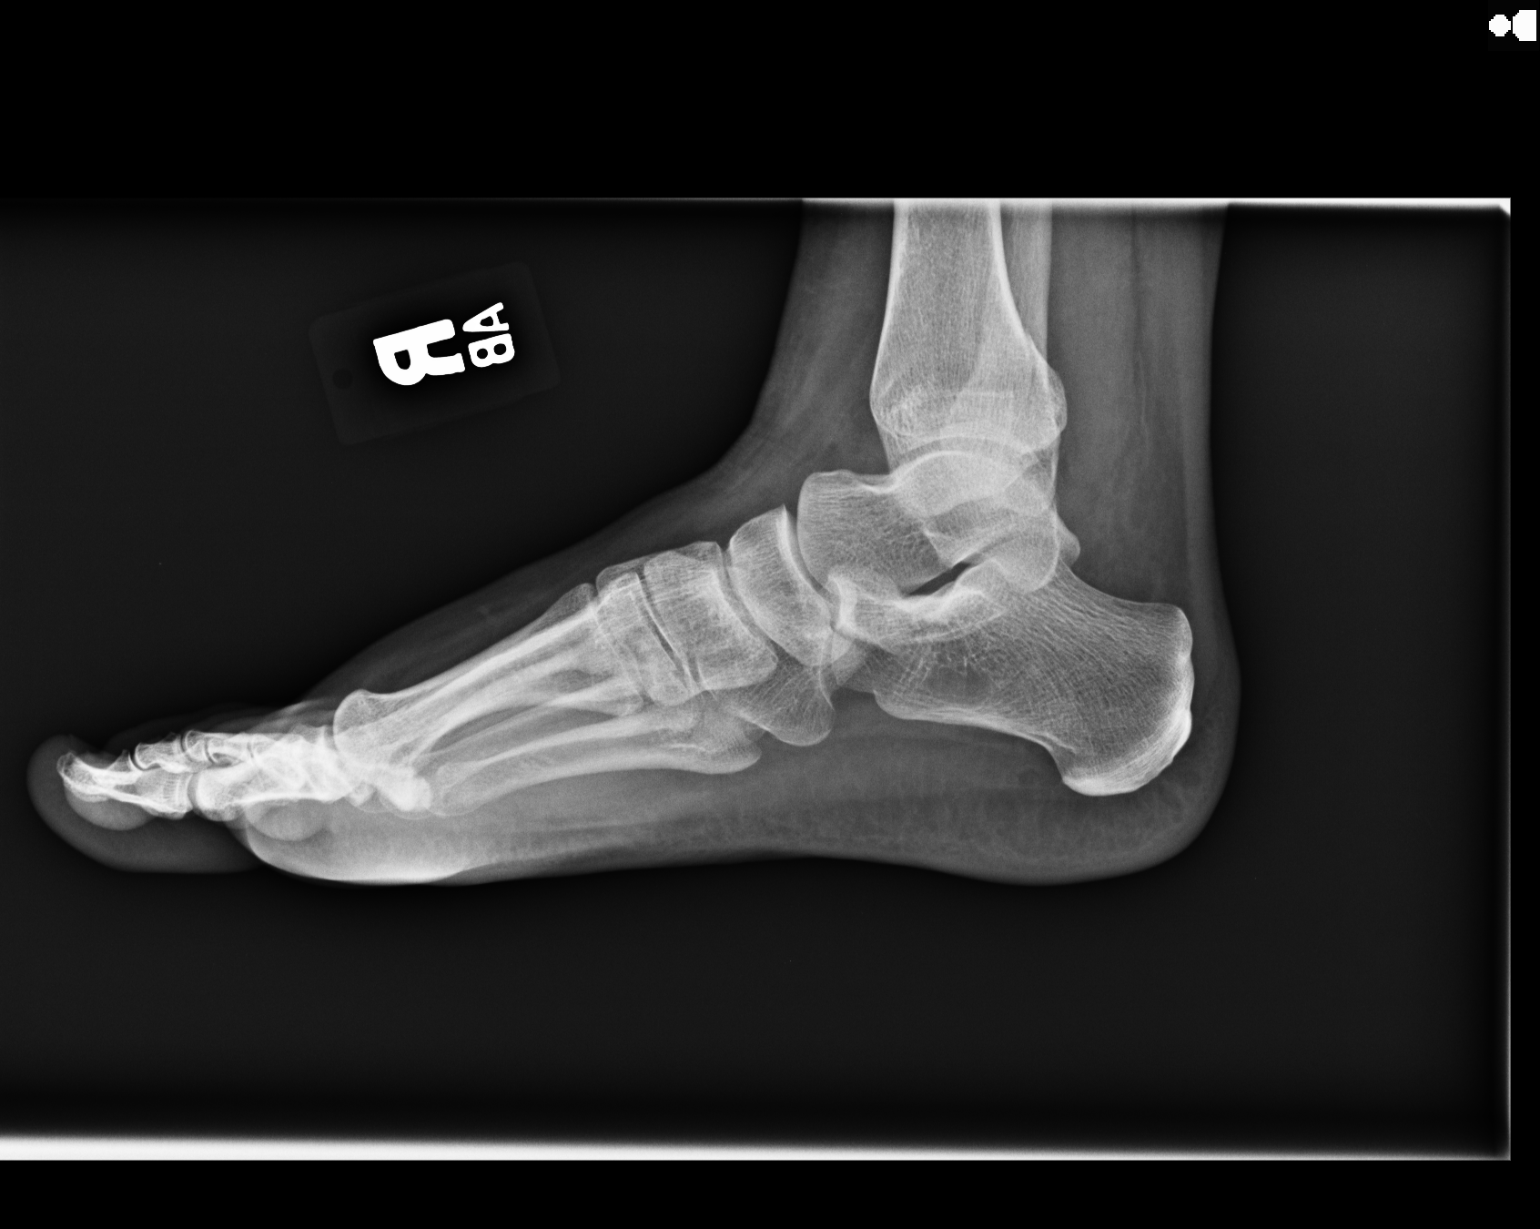

[3 of 3 positions shown; findings below may reference images not displayed]

FINDINGS: Three views of the right foot submitted. No acute fracture or
subluxation. No radiopaque foreign body.
IMPRESSION: Negative.

## 2017-09-14 HISTORY — PX: OTHER SURGICAL HISTORY: SHX169

## 2018-01-16 IMAGING — CT CT ABD-PELV W/ CM
1 of 2 series · 15 of 32 positions shown, 19 images · IV contrast (omnipaque)
Comparison: None.

CLINICAL DATA: Emesis with coffee-grounds appearance. Vomiting
starting this morning. History of alcohol abuse.

EXAM:
CT ABDOMEN AND PELVIS WITH CONTRAST
TECHNIQUE: Multidetector CT imaging of the abdomen and pelvis was performed
using the standard protocol following bolus administration of
intravenous contrast.
CONTRAST:  1 OMNIPAQUE IOHEXOL 240 MG/ML SOLN, 100mL OMNIPAQUE
IOHEXOL 300 MG/ML SOLN

[Series 2: routine abd pel with · axial · 0.63mm/px · z∈[-1166,-771]mm · 15 of 87 slices shown, 19 images]
[im 4/87  soft-tissue]
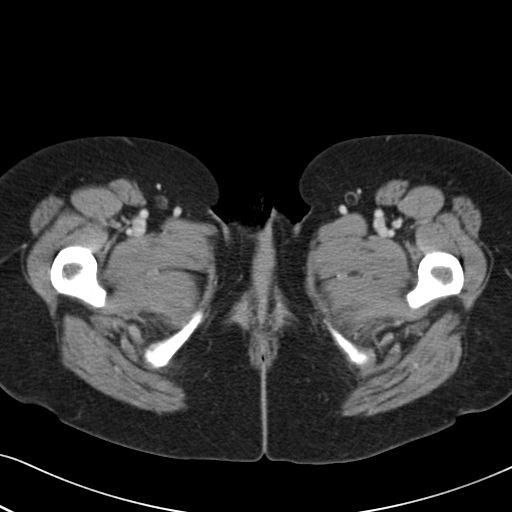
[im 4/87  bone]
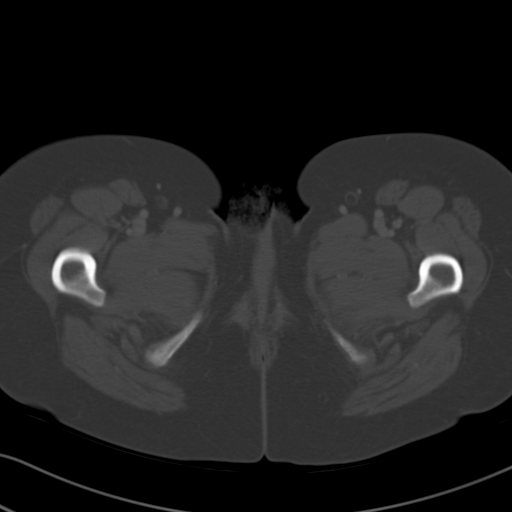
[im 11/87  soft-tissue]
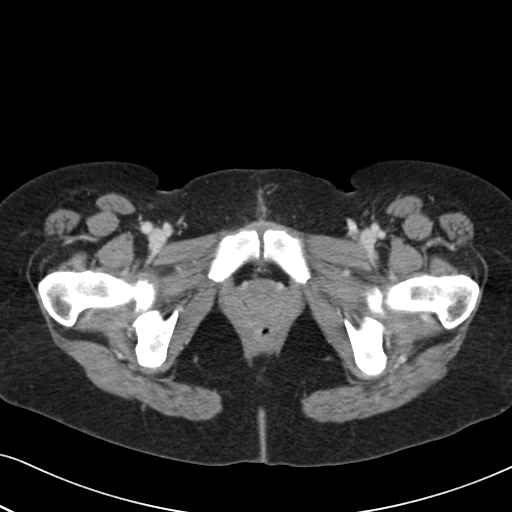
[im 18/87  soft-tissue]
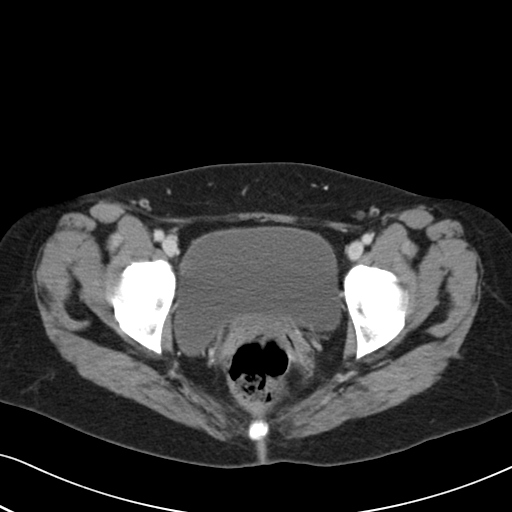
[im 25/87  soft-tissue]
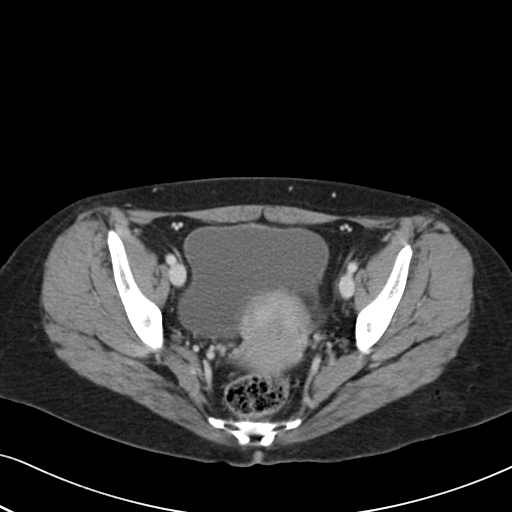
[im 31/87  soft-tissue]
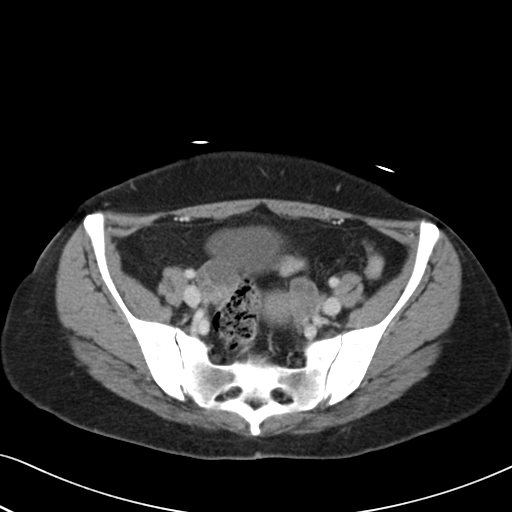
[im 38/87  soft-tissue]
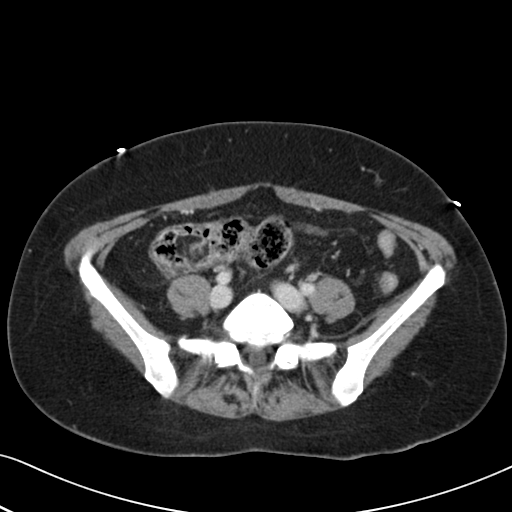
[im 45/87  soft-tissue]
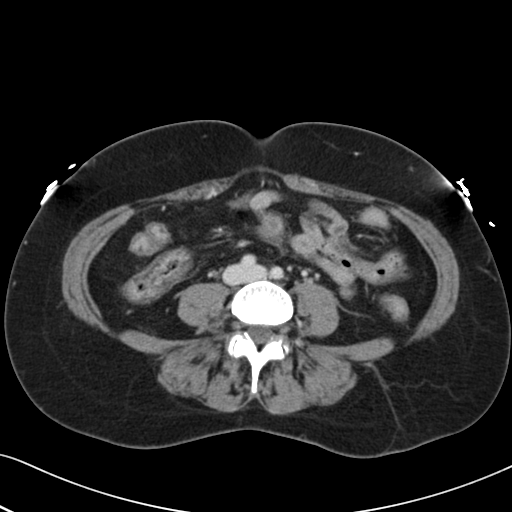
[im 49/87  soft-tissue]
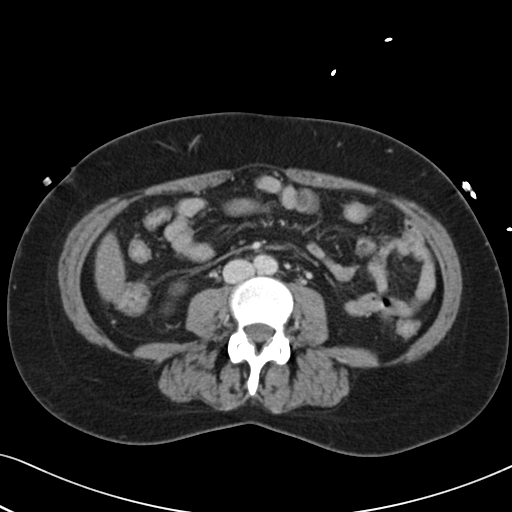
[im 56/87  soft-tissue]
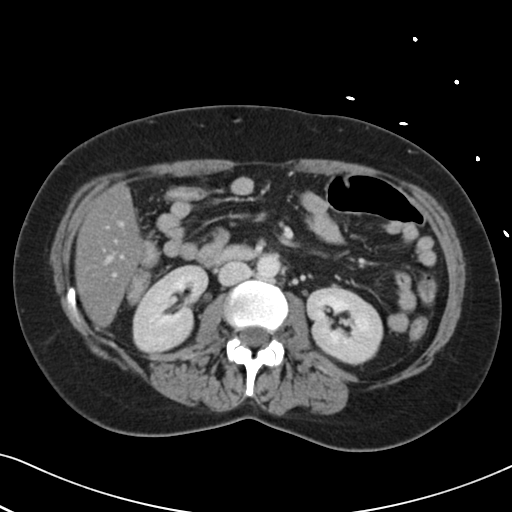
[im 56/87  bone]
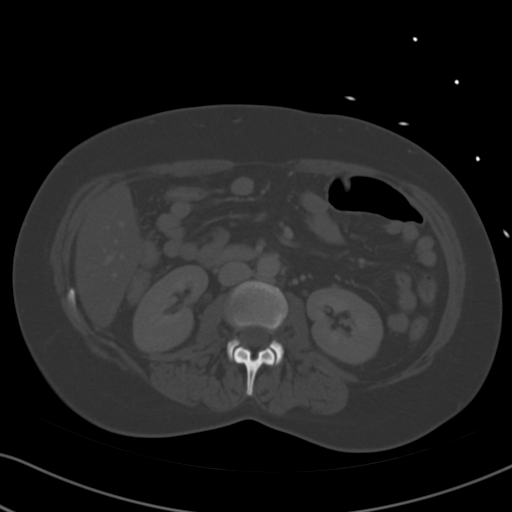
[im 62/87  soft-tissue]
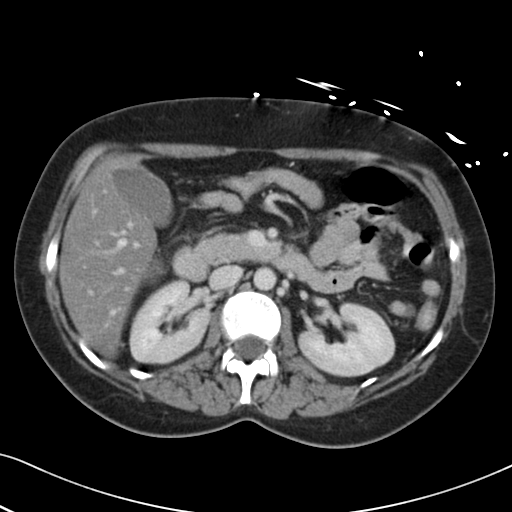
[im 69/87  soft-tissue]
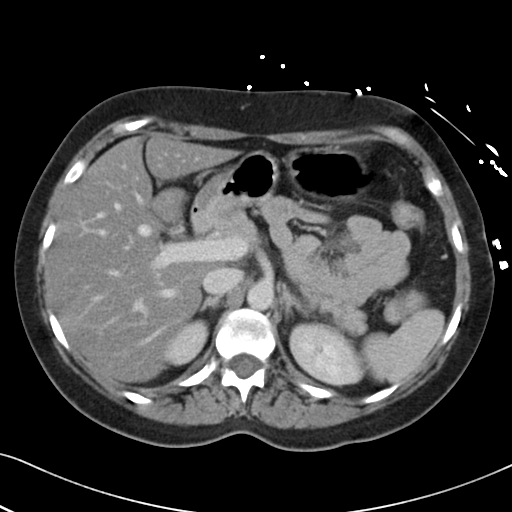
[im 73/87  lung]
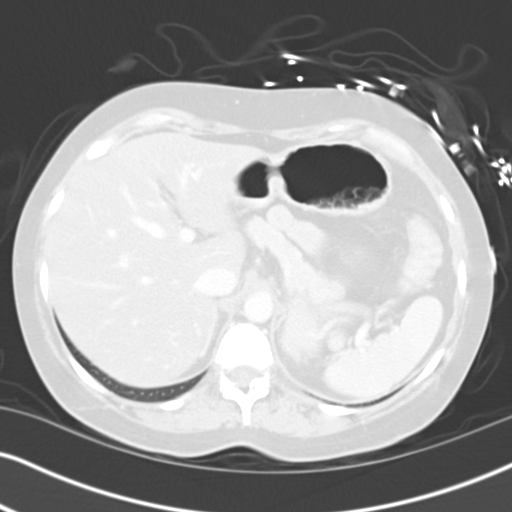
[im 76/87  soft-tissue]
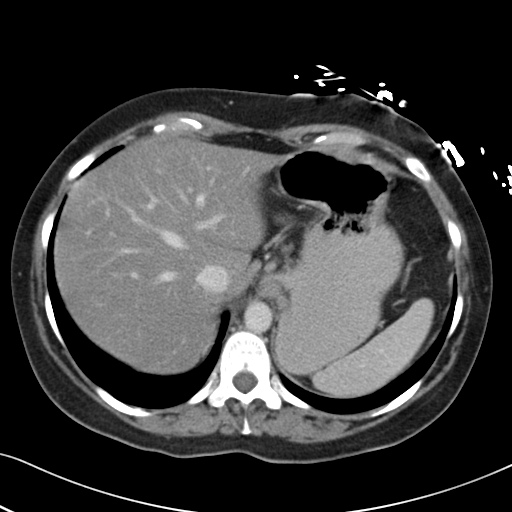
[im 76/87  lung]
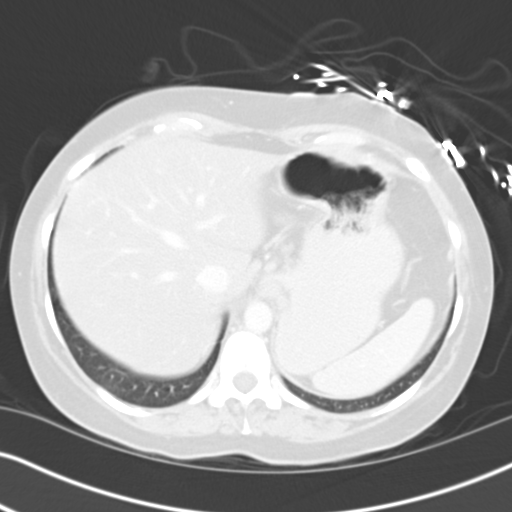
[im 80/87  lung]
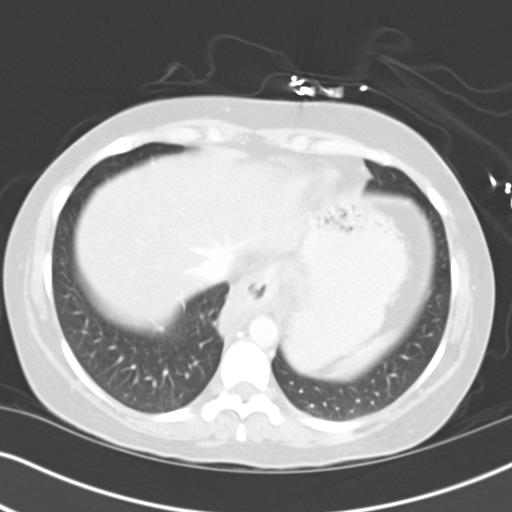
[im 83/87  soft-tissue]
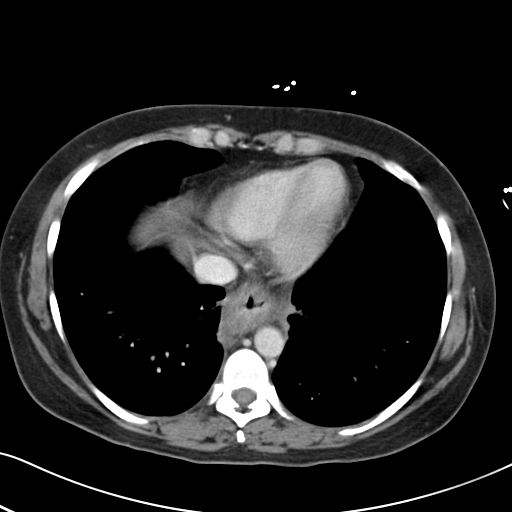
[im 83/87  lung]
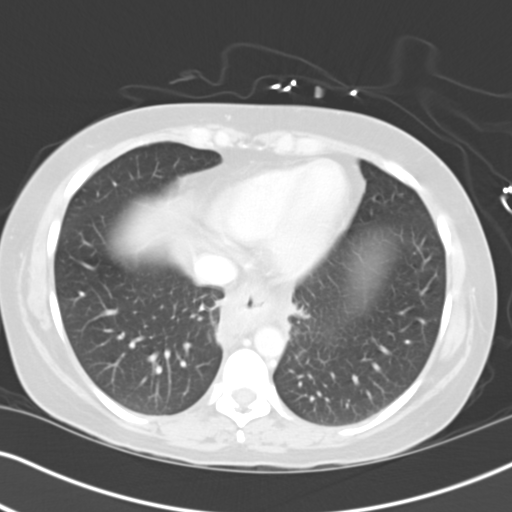

[15 of 32 positions shown; findings below may reference images not displayed]

FINDINGS: Lower chest: Prominent thickening of the walls of the lower thoracic
esophagus, with associated paraesophageal fluid/inflammation.

Hepatobiliary: Liver is low in density suggesting fatty
infiltration. Gallbladder appears normal. No bile duct dilatation.

Pancreas: No mass, inflammatory changes, or other significant
abnormality.

Spleen: Within normal limits in size and appearance.

Adrenals/Urinary Tract: No masses identified. No evidence of
hydronephrosis.

Stomach/Bowel: Large and small bowel are normal in caliber. No
thickening of the walls of the large or small bowel. Stomach appears
normal.

Vascular/Lymphatic: No pathologically enlarged lymph nodes. No
evidence of abdominal aortic aneurysm.

Reproductive: No mass or other significant abnormality.

Other: No free fluid or abscess collection within the abdomen or
pelvis. No free intraperitoneal air.

Musculoskeletal:  No suspicious bone lesions identified.
IMPRESSION: 1. Fairly marked thickening of the walls of the lower thoracic
esophagus, with associated paraesophageal fluid/inflammation.
Esophagitis? Boerhaave syndrome?
2. Probable fatty infiltration of the liver.
3. Remainder of the abdomen and pelvis CT is unremarkable. No free
intraperitoneal air.

## 2019-09-12 ENCOUNTER — Ambulatory Visit
Admission: EM | Admit: 2019-09-12 | Discharge: 2019-09-12 | Disposition: A | Discharge status: Home / Self Care | Payer: Medicaid Other | Attending: Family Medicine | Admitting: Family Medicine

## 2019-09-12 ENCOUNTER — Other Ambulatory Visit: Payer: Self-pay

## 2019-09-12 DIAGNOSIS — Z20822 Contact with and (suspected) exposure to covid-19: Secondary | ICD-10-CM

## 2019-09-12 DIAGNOSIS — Z20828 Contact with and (suspected) exposure to other viral communicable diseases: Secondary | ICD-10-CM

## 2019-09-12 DIAGNOSIS — F17210 Nicotine dependence, cigarettes, uncomplicated: Secondary | ICD-10-CM | POA: Diagnosis not present

## 2019-09-12 NOTE — ED Triage Notes (Signed)
Pt's mother tested POS to [place] 08/22/19. Pt reports about 1-4 days of muscle aches, chills, headache and nausea with loss of appetite. She also reports some shob. She states these symptoms occurred around 09/02/19. She denies any current symptoms.

## 2019-09-12 NOTE — Discharge Instructions (Signed)
Results available in 24 to 48 hours.  Stay home.  Take care  Dr. Racer Quam   

## 2019-09-13 LAB — NOVEL CORONAVIRUS, NAA (HOSP ORDER, SEND-OUT TO REF LAB; TAT 18-24 HRS): SARS-CoV-2, NAA: NOT DETECTED
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
## 2020-06-14 ENCOUNTER — Ambulatory Visit
Admission: EM | Admit: 2020-06-14 | Discharge: 2020-06-14 | Disposition: A | Discharge status: Home / Self Care | Payer: Medicaid Other | Attending: Emergency Medicine | Admitting: Emergency Medicine

## 2020-06-14 ENCOUNTER — Encounter: Payer: Self-pay | Admitting: Emergency Medicine

## 2020-06-14 DIAGNOSIS — J302 Other seasonal allergic rhinitis: Secondary | ICD-10-CM | POA: Insufficient documentation

## 2020-06-14 DIAGNOSIS — Z20822 Contact with and (suspected) exposure to covid-19: Secondary | ICD-10-CM | POA: Diagnosis not present

## 2020-06-14 MED ORDER — IBUPROFEN 600 MG PO TABS: 600.0000 mg | ORAL_TABLET | Freq: Four times a day (QID) | ORAL | 0 refills | Status: AC | PRN

## 2020-06-14 MED ORDER — FLUTICASONE PROPIONATE 50 MCG/ACT NA SUSP: 2.0000 | Freq: Every day | NASAL | 0 refills | Status: AC

## 2020-06-14 NOTE — Discharge Instructions (Addendum)
We will contact you if your Covid test come back positive.  Believe you will be a candidate for monoclonal antibody infusion.  Use Flonase as directed.  Saline nasal irrigation as often as you want with a Lloyd Huger med sinus rinse and distilled water, and antihistamine/decongestant combination of your choice such as Claritin-D, Allegra-D, Zyrtec-D, 600 mg of ibuprofen combined with 1000 mg of Tylenol 4 times a day as needed for headaches, pain.
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
## 2020-06-14 NOTE — ED Triage Notes (Signed)
Pt presents to r/o COVID.  Two family members have tested positive recently.  Reports nasal congestion, intermittent HA, a little bit of a cough.  Denies fever, SOB. Has not had COVID vaccine but believes she had COVID last year.

## 2020-06-15 LAB — SARS CORONAVIRUS 2 (TAT 6-24 HRS): SARS Coronavirus 2: NEGATIVE
